# Patient Record
Sex: Female | Born: 1970 | Race: White | Hispanic: No | Marital: Married | Smoking: Never smoker
Health system: Southern US, Community
[De-identification: ages and names within clinical notes are randomized; demographics above are authoritative.]

## PROBLEM LIST (undated history)

## (undated) DIAGNOSIS — I10 Essential (primary) hypertension: Secondary | ICD-10-CM

---

## 1998-04-24 ENCOUNTER — Emergency Department (HOSPITAL_COMMUNITY): Admission: EM | Admit: 1998-04-24 | Discharge: 1998-04-24 | Payer: Self-pay | Admitting: Emergency Medicine

## 2005-11-23 ENCOUNTER — Encounter: Payer: Self-pay | Admitting: Emergency Medicine

## 2009-07-04 ENCOUNTER — Emergency Department (HOSPITAL_BASED_OUTPATIENT_CLINIC_OR_DEPARTMENT_OTHER): Admission: EM | Admit: 2009-07-04 | Discharge: 2009-07-05 | Payer: Self-pay | Admitting: Emergency Medicine

## 2009-07-04 ENCOUNTER — Ambulatory Visit: Payer: Self-pay | Admitting: Diagnostic Radiology

## 2010-02-19 ENCOUNTER — Encounter: Admission: RE | Admit: 2010-02-19 | Discharge: 2010-02-19 | Payer: Self-pay | Admitting: Family Medicine

## 2010-06-03 ENCOUNTER — Emergency Department (HOSPITAL_BASED_OUTPATIENT_CLINIC_OR_DEPARTMENT_OTHER): Admission: EM | Admit: 2010-06-03 | Discharge: 2010-06-03 | Payer: Self-pay | Admitting: Emergency Medicine

## 2010-06-03 ENCOUNTER — Ambulatory Visit: Payer: Self-pay | Admitting: Diagnostic Radiology

## 2010-10-05 LAB — COMPREHENSIVE METABOLIC PANEL
ALT: 41 U/L — ABNORMAL HIGH (ref 0–35)
AST: 37 U/L (ref 0–37)
Alkaline Phosphatase: 46 U/L (ref 39–117)
CO2: 26 mEq/L (ref 19–32)
GFR calc non Af Amer: >60 mL/min (ref >60)
Glucose, Bld: 82 mg/dL (ref 70–99)
Potassium: 3.7 mEq/L (ref 3.5–5.1)
Sodium: 141 mEq/L (ref 135–145)

## 2010-10-05 LAB — URINALYSIS, ROUTINE W REFLEX MICROSCOPIC
Bilirubin Urine: NEGATIVE
Glucose, UA: NEGATIVE mg/dL
Hgb urine dipstick: NEGATIVE
Ketones, ur: NEGATIVE mg/dL
Nitrite: NEGATIVE
Protein, ur: NEGATIVE mg/dL
Specific Gravity, Urine: 1.009 (ref 1.005–1.030)
Urobilinogen, UA: 1 mg/dL (ref 0.0–1.0)
pH: 5.5 (ref 5.0–8.0)

## 2010-10-05 LAB — CBC
HCT: 42.2 % (ref 36.0–46.0)
Hemoglobin: 14.6 g/dL (ref 12.0–15.0)
RBC: 4.8 MIL/uL (ref 3.87–5.11)
WBC: 13.4 10*3/uL — ABNORMAL HIGH (ref 4.0–10.5)

## 2010-10-05 LAB — DIFFERENTIAL
Basophils Relative: 0 % (ref 0–1)
Eosinophils Absolute: 3.8 10*3/uL — ABNORMAL HIGH (ref 0.0–0.7)
Lymphs Abs: 2.8 10*3/uL (ref 0.7–4.0)
Monocytes Absolute: 0.7 10*3/uL (ref 0.1–1.0)
Neutrophils Relative %: 46 % (ref 43–77)

## 2010-10-05 LAB — LIPASE, BLOOD: Lipase: 129 U/L (ref 23–300)

## 2010-10-05 LAB — HEMOCCULT GUIAC POC 1CARD (OFFICE): Fecal Occult Bld: POSITIVE

## 2010-10-26 LAB — COMPREHENSIVE METABOLIC PANEL
Albumin: 4.2 g/dL (ref 3.5–5.2)
Alkaline Phosphatase: 58 U/L (ref 39–117)
BUN: 20 mg/dL (ref 6–23)
Calcium: 9.1 mg/dL (ref 8.4–10.5)
Glucose, Bld: 84 mg/dL (ref 70–99)
Potassium: 4 mEq/L (ref 3.5–5.1)
Total Protein: 6.9 g/dL (ref 6.0–8.3)

## 2010-10-26 LAB — POCT CARDIAC MARKERS
CKMB, poc: 2.2 ng/mL (ref 1.0–8.0)
Myoglobin, poc: 46.3 ng/mL (ref 12–200)
Troponin i, poc: <0.05 ng/mL (ref 0.00–0.09)
Troponin i, poc: <0.05 ng/mL (ref 0.00–0.09)

## 2010-10-26 LAB — CBC
HCT: 40.5 % (ref 36.0–46.0)
MCHC: 35 g/dL (ref 30.0–36.0)
Platelets: 268 10*3/uL (ref 150–400)
RDW: 12.7 % (ref 11.5–15.5)

## 2010-10-26 LAB — DIFFERENTIAL
Lymphocytes Relative: 30 % (ref 12–46)
Lymphs Abs: 2.4 10*3/uL (ref 0.7–4.0)
Monocytes Absolute: 0.6 10*3/uL (ref 0.1–1.0)
Monocytes Relative: 8 % (ref 3–12)
Neutro Abs: 4.3 10*3/uL (ref 1.7–7.7)
Neutrophils Relative %: 53 % (ref 43–77)

## 2010-10-26 LAB — LIPASE, BLOOD: Lipase: 179 U/L (ref 23–300)

## 2011-08-22 ENCOUNTER — Other Ambulatory Visit: Payer: Self-pay | Admitting: Family Medicine

## 2011-08-22 DIAGNOSIS — Z1231 Encounter for screening mammogram for malignant neoplasm of breast: Secondary | ICD-10-CM

## 2011-09-15 ENCOUNTER — Ambulatory Visit: Payer: Self-pay

## 2011-09-19 ENCOUNTER — Ambulatory Visit
Admission: RE | Admit: 2011-09-19 | Discharge: 2011-09-19 | Disposition: A | Discharge status: Home / Self Care | Payer: PRIVATE HEALTH INSURANCE | Source: Ambulatory Visit | Attending: Family Medicine | Admitting: Family Medicine

## 2011-09-19 DIAGNOSIS — Z1231 Encounter for screening mammogram for malignant neoplasm of breast: Secondary | ICD-10-CM

## 2012-04-12 IMAGING — MG MM DIGITAL SCREENING BILAT
4 series · 4 of 4 positions shown · non-contrast
Comparison: none

DG SCREEN MAMMOGRAM BILATERAL
Bilateral CC and MLO view(s) were taken.
Technologist: Cruz Suzanne

DIGITAL SCREENING MAMMOGRAM WITH CAD:
There are scattered fibroglandular densities.  No masses or malignant type calcifications are 
identified.
Images were processed with CAD.

[R CC]
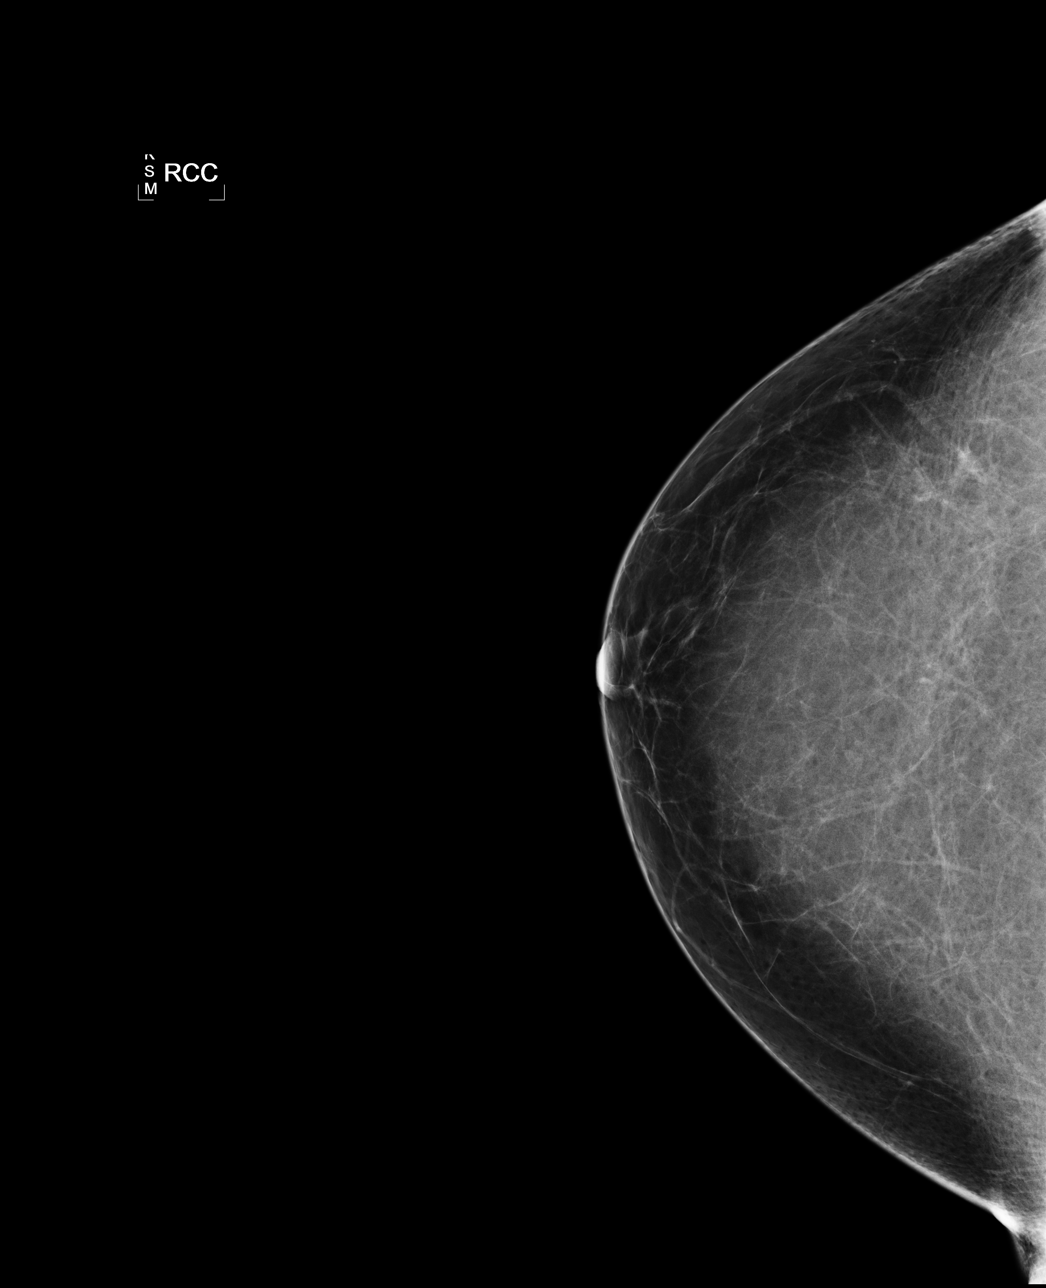

[L CC]
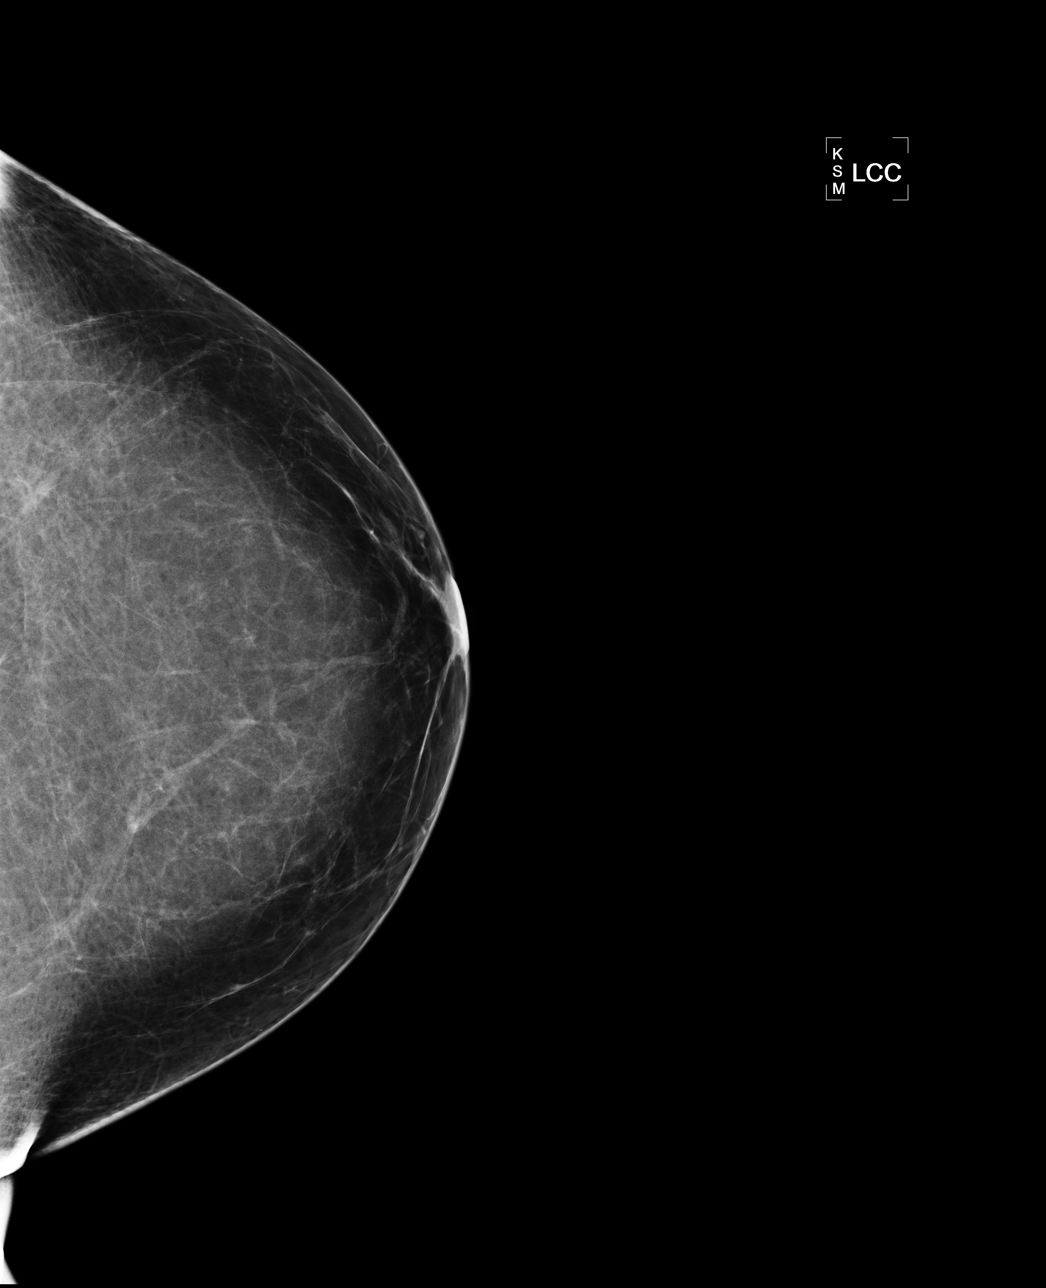

[L MLO]
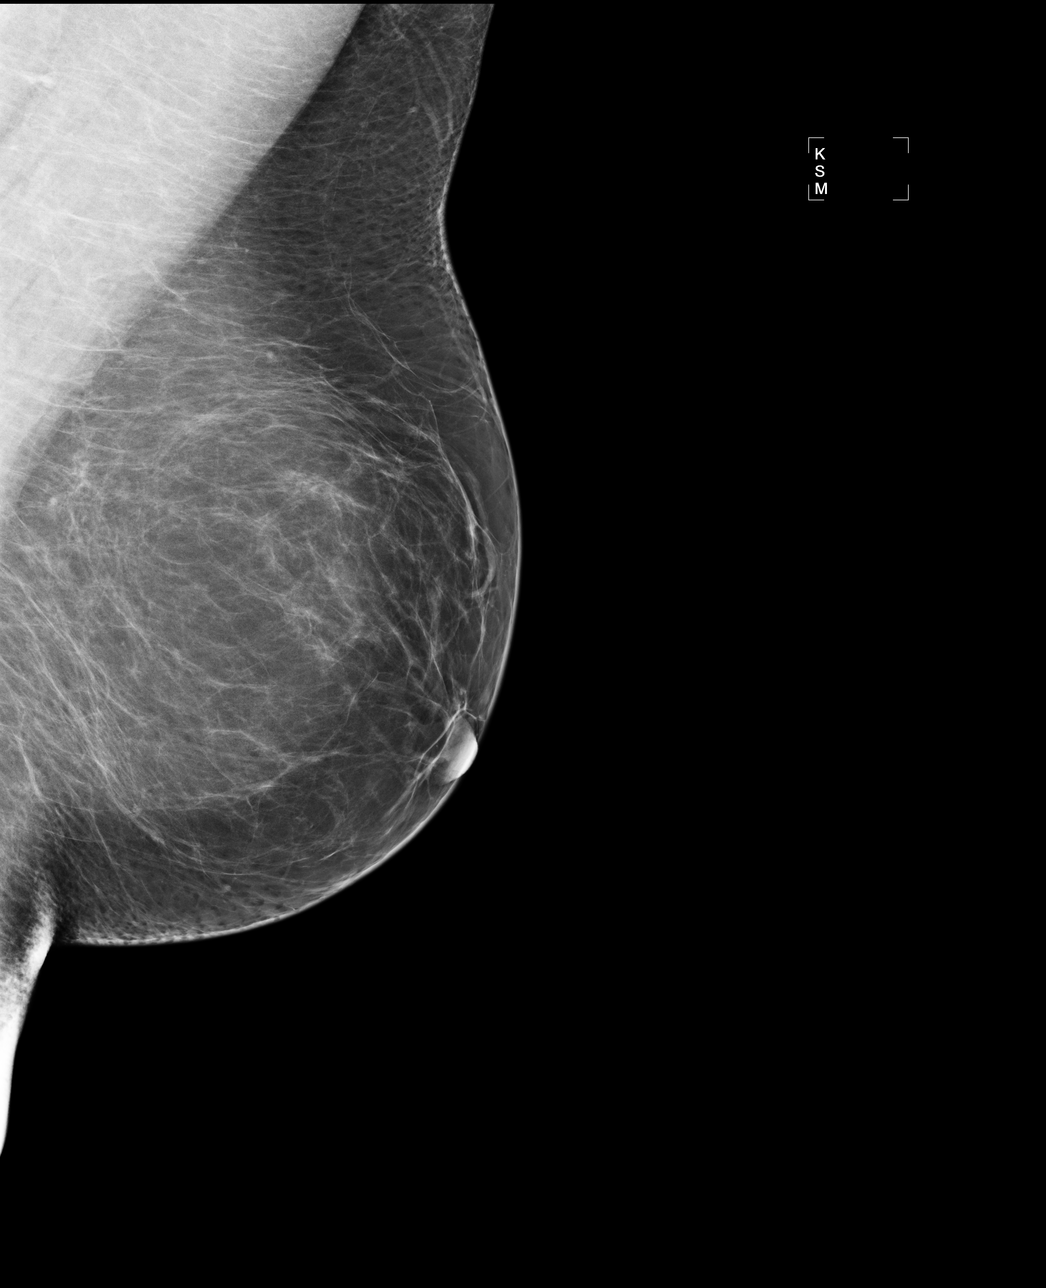

[R MLO]
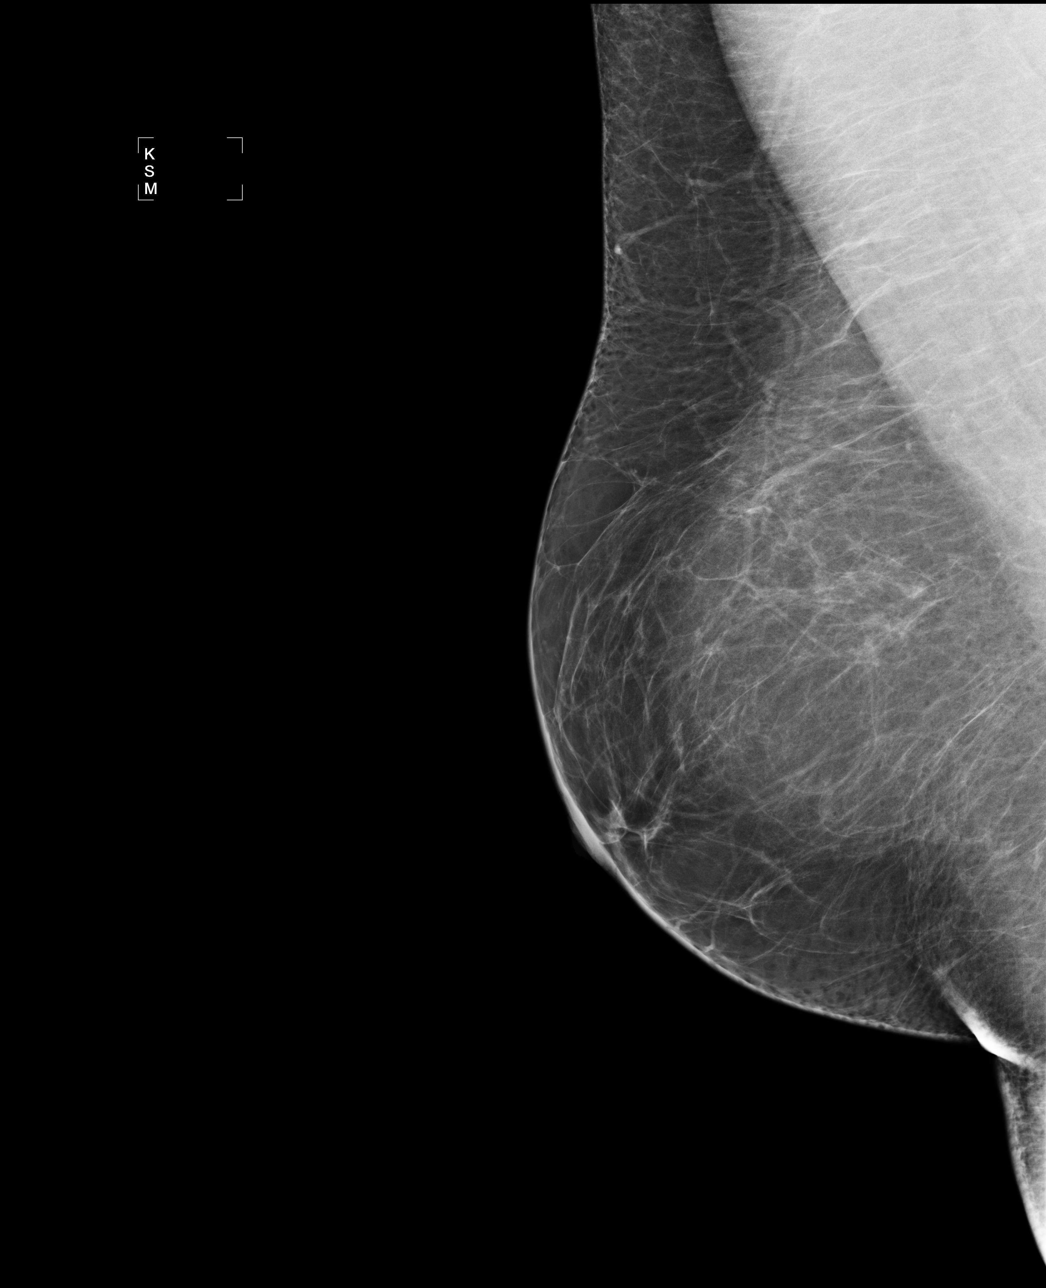

[4 of 4 positions shown; findings below may reference images not displayed]

IMPRESSION: No specific mammographic evidence of malignancy.  Next screening mammogram is recommended in one 
year.

A result letter of this screening mammogram will be mailed directly to the patient.

ASSESSMENT: Negative - BI-RADS 1

Screening mammogram in 1 year.
,

## 2020-10-19 ENCOUNTER — Other Ambulatory Visit: Payer: Self-pay

## 2020-10-19 ENCOUNTER — Emergency Department (HOSPITAL_COMMUNITY)
Admission: EM | Admit: 2020-10-19 | Discharge: 2020-10-19 | Disposition: A | Discharge status: Home / Self Care | Payer: BLUE CROSS/BLUE SHIELD | Attending: Emergency Medicine | Admitting: Emergency Medicine

## 2020-10-19 DIAGNOSIS — Z5321 Procedure and treatment not carried out due to patient leaving prior to being seen by health care provider: Secondary | ICD-10-CM | POA: Insufficient documentation

## 2020-10-19 DIAGNOSIS — Y9301 Activity, walking, marching and hiking: Secondary | ICD-10-CM | POA: Insufficient documentation

## 2020-10-19 DIAGNOSIS — S0191XA Laceration without foreign body of unspecified part of head, initial encounter: Secondary | ICD-10-CM | POA: Insufficient documentation

## 2020-10-19 DIAGNOSIS — W228XXA Striking against or struck by other objects, initial encounter: Secondary | ICD-10-CM | POA: Diagnosis not present

## 2020-10-19 NOTE — ED Notes (Signed)
Pt stated that she was going to her family practice and let them stitch it up. Pt seen walking out of the door

## 2020-10-19 NOTE — ED Triage Notes (Signed)
Stated she hit the top of her head against a metal object as she was walking up the steps; + laceration; denies LOC but felt "woozy"

## 2021-06-13 ENCOUNTER — Emergency Department (HOSPITAL_BASED_OUTPATIENT_CLINIC_OR_DEPARTMENT_OTHER)
Admission: EM | Admit: 2021-06-13 | Discharge: 2021-06-13 | Disposition: A | Discharge status: Home / Self Care | Payer: BLUE CROSS/BLUE SHIELD | Attending: Emergency Medicine | Admitting: Emergency Medicine

## 2021-06-13 ENCOUNTER — Emergency Department (HOSPITAL_BASED_OUTPATIENT_CLINIC_OR_DEPARTMENT_OTHER): Payer: BLUE CROSS/BLUE SHIELD

## 2021-06-13 ENCOUNTER — Encounter (HOSPITAL_BASED_OUTPATIENT_CLINIC_OR_DEPARTMENT_OTHER): Payer: Self-pay

## 2021-06-13 ENCOUNTER — Emergency Department (HOSPITAL_BASED_OUTPATIENT_CLINIC_OR_DEPARTMENT_OTHER): Payer: BLUE CROSS/BLUE SHIELD | Admitting: Radiology

## 2021-06-13 DIAGNOSIS — S76312A Strain of muscle, fascia and tendon of the posterior muscle group at thigh level, left thigh, initial encounter: Secondary | ICD-10-CM

## 2021-06-13 DIAGNOSIS — W19XXXA Unspecified fall, initial encounter: Secondary | ICD-10-CM

## 2021-06-13 DIAGNOSIS — M25552 Pain in left hip: Secondary | ICD-10-CM | POA: Insufficient documentation

## 2021-06-13 DIAGNOSIS — W010XXA Fall on same level from slipping, tripping and stumbling without subsequent striking against object, initial encounter: Secondary | ICD-10-CM | POA: Diagnosis not present

## 2021-06-13 DIAGNOSIS — I10 Essential (primary) hypertension: Secondary | ICD-10-CM | POA: Insufficient documentation

## 2021-06-13 DIAGNOSIS — S79922A Unspecified injury of left thigh, initial encounter: Secondary | ICD-10-CM | POA: Diagnosis present

## 2021-06-13 DIAGNOSIS — M25559 Pain in unspecified hip: Secondary | ICD-10-CM

## 2021-06-13 DIAGNOSIS — R202 Paresthesia of skin: Secondary | ICD-10-CM | POA: Insufficient documentation

## 2021-06-13 HISTORY — DX: Essential (primary) hypertension: I10

## 2021-06-13 LAB — PREGNANCY, URINE: Preg Test, Ur: NEGATIVE

## 2021-06-13 MED ORDER — HYDROMORPHONE HCL 1 MG/ML IJ SOLN
1.0000 mg | Freq: Once | INTRAMUSCULAR | Status: AC
  Administered 2021-06-13: 1 mg via INTRAVENOUS
  Filled 2021-06-13: qty 1

## 2021-06-13 MED ORDER — OXYCODONE HCL 5 MG PO TABS: 5.0000 mg | ORAL_TABLET | Freq: Four times a day (QID) | ORAL | 0 refills | Status: AC | PRN

## 2021-06-13 MED ORDER — DIAZEPAM 5 MG/ML IJ SOLN
5.0000 mg | Freq: Once | INTRAMUSCULAR | Status: AC
  Administered 2021-06-13: 5 mg via INTRAVENOUS
  Filled 2021-06-13: qty 2

## 2021-06-13 MED ORDER — CYCLOBENZAPRINE HCL 10 MG PO TABS: 10.0000 mg | ORAL_TABLET | Freq: Two times a day (BID) | ORAL | 0 refills | Status: AC | PRN

## 2021-06-13 NOTE — Discharge Instructions (Addendum)
Recommend 800 mg ibuprofen every 8 hours as needed.  Recommend 1000 mg of Tylenol every 6 hours as needed for pain.  I have written you for narcotic pain medicine called oxycodone as well as a muscle relaxant and Flexeril.  Please be careful when using these medications as they are sedating.  Do not drive or do any dangerous activities while taking these medicines.  Follow-up with sports medicine.

## 2021-06-13 NOTE — ED Provider Notes (Signed)
Patient signed out to me awaiting x-ray of her low back.  Patient had a fall where she likely pulled her hamstring or has some soft tissue injury.  X-rays unremarkable thus far.  X-ray of the low back unremarkable.  We will have her follow-up with sports medicine.  Suspect muscle strain/pull.  Given crutches.  Written for oxycodone and Flexeril to use in addition with Tylenol and ibuprofen and ice.  Discharged in good condition.  This chart was dictated using voice recognition software.  Despite best efforts to proofread,  errors can occur which can change the documentation meaning.    Virgina Norfolk, DO 06/13/21 1621

## 2021-06-13 NOTE — ED Triage Notes (Signed)
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 She c/o left low back and hip and leg pain; and that initially her left leg felt "numb", however she says that had "gotten a lot better now". CMS intact all toes bilat.

## 2021-06-13 NOTE — ED Notes (Addendum)
Pt able to flex left hip laterally w/o pain and foot/ankle in all directions without pain.  Pt reports marked hamstring pain with extension of Left hip and extension of left knee.

## 2021-06-13 NOTE — ED Provider Notes (Signed)
[facility] Provider Note   CSN: [id number] Arrival date & time: 06/13/21  1225     History Chief Complaint  Patient presents with   Lytle Michaels    Tara Dawson is a 50 y.o. female.  HPI     Was stepping over a bail of hay the long way with the left leg and then fell into the splits,left leg went numb and then was able to move bail of hay and now having pain in the left hip below the buttocks, stops hurting behind calf and goes behind left leg to ankle. Action similar to that of someone doing hurdles.  No back pain Feels better bent leg but thinks could straighten it Has not walked since it happened  No continuing numbness or weakness, no loss of control of bowel or bladder Pain reports pain around 4/10 and is more like 6/10 with movement but also describes and appears to be experiencing more severe pain.      Past Medical History:  Diagnosis Date   Hypertension     There are no problems to display for this patient.  OB History   No obstetric history on file.     No family history on file.  Social History   Tobacco Use   Smoking status: Never  Substance Use Topics   Alcohol use: Yes   Drug use: Never    Home Medications Prior to Admission medications   Medication Sig Start Date End Date Taking? Authorizing Provider  cyclobenzaprine (FLEXERIL) 10 MG tablet Take 1 tablet (10 mg total) by mouth 2 (two) times daily as needed for muscle spasms. 06/13/21  Yes Curatolo, Adam, DO  oxyCODONE (ROXICODONE) 5 MG immediate release tablet Take 1 tablet (5 mg total) by mouth every 6 (six) hours as needed for up to 10 doses for breakthrough pain. 06/13/21  Yes Curatolo, Adam, DO    Allergies    Patient has no known allergies.  Review of Systems   Review of Systems  Constitutional:  Negative for fever.  Eyes:  Negative for visual disturbance.  Respiratory:  Negative for shortness of breath.   Cardiovascular:  Negative for chest pain.   Gastrointestinal:  Negative for abdominal pain and vomiting.  Genitourinary:  Negative for difficulty urinating and dysuria.  Musculoskeletal:  Positive for arthralgias, gait problem and myalgias. Negative for back pain and neck pain.  Skin:  Negative for rash.  Neurological:  Negative for syncope, weakness, numbness (not current) and headaches.   Physical Exam Updated Vital Signs BP 114/88   Pulse 63   Temp 98.2 F (36.8 C) (Oral)   Resp 18   LMP  (LMP Unknown)   SpO2 95%   Physical Exam Vitals and nursing note reviewed.  Constitutional:      General: She is not in acute distress.    Appearance: Normal appearance. She is not ill-appearing, toxic-appearing or diaphoretic.  HENT:     Head: Normocephalic.  Eyes:     Conjunctiva/sclera: Conjunctivae normal.  Cardiovascular:     Rate and Rhythm: Normal rate and regular rhythm.     Pulses: Normal pulses.  Pulmonary:     Effort: Pulmonary effort is normal. No respiratory distress.  Musculoskeletal:        General: No deformity or signs of injury.     Cervical back: No rigidity.     Comments: Holding bilateral legs in flexion Able to flex and extend at knee, hip, ankle, knee extension is with significant pain Tightness, swelling,  and tenderness over hamstring   Skin:    General: Skin is warm and dry.     Coloration: Skin is not jaundiced or pale.  Neurological:     General: No focal deficit present.     Mental Status: She is alert and oriented to person, place, and time.    ED Results / Procedures / Treatments   Labs (all labs ordered are listed, but only abnormal results are displayed) Labs Reviewed  PREGNANCY, URINE    EKG None  Radiology DG Lumbar Spine Complete  Result Date: 06/13/2021 CLINICAL DATA:  Trauma, fall EXAM: LUMBAR SPINE - COMPLETE 4+ VIEW COMPARISON:  None. FINDINGS: No recent fracture is seen. Alignment of posterior margins of vertebral bodies is unremarkable. Degenerative changes are noted with  marked disc space narrowing, bony spurs and facet hypertrophy at L5-S1 level. IUD is seen in the pelvis. There is 2 mm calcific density overlying the lower pole of right kidney. IMPRESSION: No recent fracture is seen. Degenerative changes are noted at the L5-S1 level. Possible 2 mm right renal stone. Electronically Signed   By: Elmer Picker M.D.   On: 06/13/2021 16:08   DG Pelvis 1-2 Views  Result Date: 06/13/2021 CLINICAL DATA:  Slipped and fell with low back and hip/leg pain. EXAM: PELVIS - 1-2 VIEW COMPARISON:  None. FINDINGS: Exam demonstrates mild symmetric degenerative change of the hips. There is no definite acute fracture or dislocation. Patient is slightly rotated making evaluation of the left ischium and acetabulum suboptimal. There are degenerative changes of the spine and sacroiliac joints. IUD is present in adequate position. IMPRESSION: 1. No acute findings. Slight rotation makes evaluation of the left acetabulum and ischium suboptimal. Consider dedicated cone-down views of the left hip for better evaluation in this patient with left hip pain. 2. 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 Degenerative changes of the spine and sacroiliac joints. Electronically Signed   By: Marin Olp M.D.   On: 06/13/2021 13:57   DG HIP UNILAT WITH PELVIS 2-3 VIEWS LEFT  Result Date: 06/13/2021 CLINICAL DATA:  Left hip pain. EXAM: DG HIP (WITH OR WITHOUT PELVIS) 2-3V LEFT COMPARISON:  Left femur radiographs 06/14/2019 FINDINGS: There is no evidence of hip fracture or dislocation. There is no evidence of arthropathy or other focal bone abnormality. IMPRESSION: Negative. Electronically Signed   By: Audie Pinto M.D.   On: 06/13/2021 14:35   DG Femur Min 2 Views Left  Result Date: 06/13/2021 CLINICAL DATA:  Fall with low back and left hip/leg pain. EXAM: LEFT FEMUR 2 VIEWS COMPARISON:  None. 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 No evidence of acute fracture or  dislocation. Soft tissues unremarkable. IMPRESSION: 1. No acute findings. 2. 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 Electronically Signed   By: Marin Olp M.D.   On: 06/13/2021 13:59    Procedures Procedures   Medications Ordered in ED Medications  HYDROmorphone (DILAUDID) injection 1 mg (1 mg Intravenous Given 06/13/21 1310)  diazepam (VALIUM) injection 5 mg (5 mg Intravenous Given 06/13/21 1507)    ED Course  I have reviewed the triage vital signs and the nursing notes.  Pertinent labs & imaging results that were available during my care of the patient were reviewed by me and considered in my medical decision making (see chart for details).    MDM Rules/Calculators/A&P  50yo female who presents with concern for left posterior leg pain after attempting to step over a hay bail and doing the splits.  XR without acute abnormality of femur, hip.  Does have some midline back tenderness on reevaluation and will obtain XR lumbar spine. Not having numbness, weakness, loss of control of bowel or bladder, doubt acute spinal surgical emergency. Low suspicion for occult pelvic fx on hx and exam.  If XR without acute fx of lumbar spine, suspect likely hamstring strain versus tear.  Is able to extend and flex but with significant pain, cannot bear weight at athis time due to pain.  Do feel hx and exam most consistent with hamstring pathology, although hamstring injury may also be causing some sciatic nerve irritation given radiation of pain. Given valium in ED, plan to dc with pain control, using walker/crutches WBAT< sports medicine follow up.Care signed out with XR pending.    Final Clinical Impression(s) / ED Diagnoses Final diagnoses:  Fall  Hip pain  Hamstring strain, left, initial encounter    Rx / DC Orders ED Discharge Orders          Ordered    oxyCODONE (ROXICODONE) 5 MG immediate release tablet  Every 6 hours PRN        06/13/21 1620     cyclobenzaprine (FLEXERIL) 10 MG tablet  2 times daily PRN        06/13/21 1620             Alvira Monday, MD 06/13/21 2303

## 2022-01-05 IMAGING — DX DG PELVIS 1-2V
1 series · 1 of 1 positions shown · non-contrast
Comparison: None.

CLINICAL DATA: Slipped and fell with low back and hip/leg pain.

EXAM:
PELVIS - 1-2 VIEW

[pelvis]
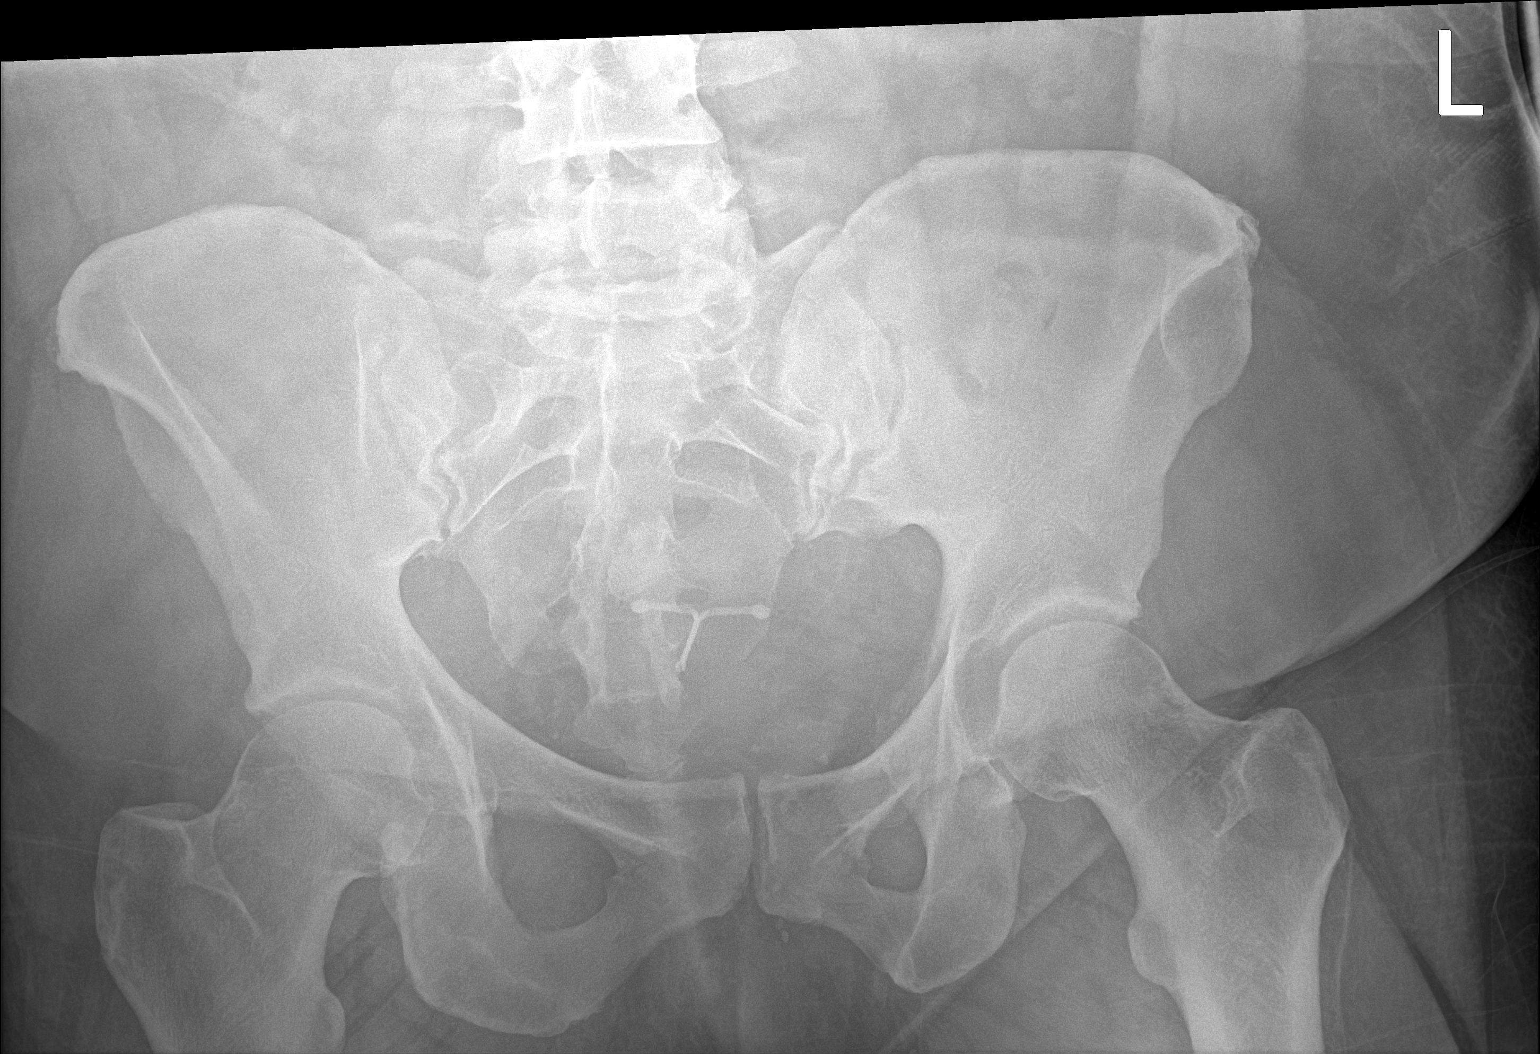

[1 of 1 positions shown; findings below may reference images not displayed]

FINDINGS: Exam demonstrates mild symmetric degenerative change of the hips.
There is no definite acute fracture or dislocation. Patient is
slightly rotated making evaluation of the left ischium and
acetabulum suboptimal. There are degenerative changes of the spine
and sacroiliac joints. IUD is present in adequate position.
IMPRESSION: 1. No acute findings. Slight rotation makes evaluation of the left
acetabulum and ischium suboptimal. Consider dedicated cone-down
views of the left hip for better evaluation in this patient with
left hip pain.
2. Mild symmetric degenerative change of the hips. Degenerative
changes of the spine and sacroiliac joints.
